# Patient Record
Sex: Female | Born: 1970 | Race: Black or African American | Hispanic: No | Marital: Married | Smoking: Former smoker
Health system: Southern US, Community
[De-identification: ages and names within clinical notes are randomized; demographics above are authoritative.]

## PROBLEM LIST (undated history)

## (undated) DIAGNOSIS — I10 Essential (primary) hypertension: Secondary | ICD-10-CM

---

## 1998-04-06 ENCOUNTER — Other Ambulatory Visit: Admission: RE | Admit: 1998-04-06 | Discharge: 1998-04-06 | Payer: Self-pay | Admitting: Obstetrics and Gynecology

## 1999-08-29 ENCOUNTER — Other Ambulatory Visit: Admission: RE | Admit: 1999-08-29 | Discharge: 1999-08-29 | Payer: Self-pay | Admitting: Gynecology

## 2000-03-26 ENCOUNTER — Other Ambulatory Visit: Admission: RE | Admit: 2000-03-26 | Discharge: 2000-03-26 | Payer: Self-pay | Admitting: Gynecology

## 2000-10-29 ENCOUNTER — Other Ambulatory Visit: Admission: RE | Admit: 2000-10-29 | Discharge: 2000-10-29 | Payer: Self-pay | Admitting: Gynecology

## 2001-12-15 ENCOUNTER — Other Ambulatory Visit: Admission: RE | Admit: 2001-12-15 | Discharge: 2001-12-15 | Payer: Self-pay | Admitting: Gynecology

## 2010-03-27 ENCOUNTER — Other Ambulatory Visit: Payer: Self-pay | Admitting: Obstetrics and Gynecology

## 2010-03-27 DIAGNOSIS — Z139 Encounter for screening, unspecified: Secondary | ICD-10-CM

## 2010-04-10 ENCOUNTER — Encounter (HOSPITAL_COMMUNITY)
Admission: RE | Admit: 2010-04-10 | Discharge: 2010-04-10 | Disposition: A | Discharge status: Home / Self Care | Payer: BC Managed Care – PPO | Source: Ambulatory Visit | Attending: Obstetrics and Gynecology | Admitting: Obstetrics and Gynecology

## 2010-04-10 LAB — CBC
MCH: 18.5 pg — ABNORMAL LOW (ref 26.0–34.0)
MCHC: 28.8 g/dL — ABNORMAL LOW (ref 30.0–36.0)
MCV: 64.1 fL — ABNORMAL LOW (ref 78.0–100.0)
Platelets: 562 10*3/uL — ABNORMAL HIGH (ref 150–400)
RDW: 17.4 % — ABNORMAL HIGH (ref 11.5–15.5)

## 2010-04-10 LAB — BASIC METABOLIC PANEL
BUN: 12 mg/dL (ref 6–23)
Calcium: 9.6 mg/dL (ref 8.4–10.5)
Creatinine, Ser: 0.81 mg/dL (ref 0.4–1.2)
GFR calc non Af Amer: >60 mL/min (ref >60)

## 2010-04-13 ENCOUNTER — Other Ambulatory Visit: Payer: Self-pay | Admitting: Obstetrics and Gynecology

## 2010-04-13 ENCOUNTER — Ambulatory Visit (HOSPITAL_COMMUNITY)
Admission: RE | Admit: 2010-04-13 | Discharge: 2010-04-13 | Disposition: A | Discharge status: Home / Self Care | Payer: BC Managed Care – PPO | Source: Ambulatory Visit | Attending: Obstetrics and Gynecology | Admitting: Obstetrics and Gynecology

## 2010-04-13 DIAGNOSIS — N840 Polyp of corpus uteri: Secondary | ICD-10-CM | POA: Insufficient documentation

## 2010-04-13 DIAGNOSIS — Z01812 Encounter for preprocedural laboratory examination: Secondary | ICD-10-CM | POA: Insufficient documentation

## 2010-04-13 DIAGNOSIS — D649 Anemia, unspecified: Secondary | ICD-10-CM | POA: Insufficient documentation

## 2010-04-13 DIAGNOSIS — Z01818 Encounter for other preprocedural examination: Secondary | ICD-10-CM | POA: Insufficient documentation

## 2010-04-13 DIAGNOSIS — N920 Excessive and frequent menstruation with regular cycle: Secondary | ICD-10-CM | POA: Insufficient documentation

## 2010-04-13 LAB — TYPE AND SCREEN: ABO/RH(D): O POS

## 2010-04-13 LAB — ABO/RH: ABO/RH(D): O POS

## 2010-04-20 ENCOUNTER — Ambulatory Visit (HOSPITAL_BASED_OUTPATIENT_CLINIC_OR_DEPARTMENT_OTHER): Payer: Self-pay

## 2010-04-30 NOTE — Op Note (Signed)
NAME:  Kaitlyn Scott, Kaitlyn Scott          ACCOUNT NO.:  [id number]  MEDICAL RECORD NO.:  [id number]           PATIENT TYPE:  O  LOCATION:  [facility]                          FAC[facility]:  [facility]  PHYSICIAN:  Pieter Partridge, MD   DATE OF BIRTH:  [birth year]  DATE OF PROCEDURE:  04/13/2010 DATE OF DISCHARGE:                              OPERATIVE REPORT   PREOPERATIVE DIAGNOSES:  Menorrhagia and endometrial mass.  POSTOPERATIVE DIAGNOSES:  Menorrhagia and endometrial mass.  PROCEDURE:  Hysteroscopy, dilation and curettage.  SURGEON:  Pieter Partridge, MD  ASSISTANT:  Technician.  ANESTHESIA:  General.  FINDINGS:  Normal intrauterine cavity, no obvious discrete endometrial mass, posteriorly there was some prominent rugae that were biopsied.  SPECIMENS:  Posterior endometrial tissue, endometrial curettings, and endocervical cord curettings.  DISPOSITION:  Specimens to Pathology.  ESTIMATED BLOOD LOSS:  Minimal.  IV FLUIDS:  1200.  URINE OUTPUT:  25 mL prior to procedure.  COMPLICATIONS:  None.  DISPOSITION:  To PACU in stable condition.  INDICATION:  A 40 year old gravida 2, para 1, A 1 with a history of increasing menorrhagia worsened in the last 3 months.  On ultrasound, there was a 1-cm fundal posterior endometrial mass that was hyperechoic and did not appear to be a polyp on ultrasound.  The patient was also severely anemic.  Her hemoglobin at the time of diagnosis was 7.4.  She has been on iron to increase it up to 8.7.  PROCEDURE IN DETAIL:  Ms. Spainhower was identified in the holding area. She was then taken to the operating room with IV running.  She was placed in the dorsal supine position and underwent general endotracheal anesthesia without complication.  She was then prepped and draped in a normal sterile fashion.  A Graves speculum was then inserted into the vagina.  After a bimanual exam confirmed an anteverted uterus, speculum was placed in the vagina, anterior  lip of the cervix was grasped with a single-tooth tenaculum, and the cervix was dilated up to a 15.  The hysteroscope was then advanced to reveal the above findings.  Two biopsies of the posterior wall were done.  The instruments were then removed.  Curettage of all 4 quadrants of the uterus was performed with minimal tissue noted.  I was actually surprised at how little endometrium she had.  Prior to the endocervix were curettings, I performed curettage of the endocervix and both specimens were sent to Path.  After the curettings were obtained, the cervical os was observed for bleeding.  Everything looked hemostatic.  Single-tooth tenaculum was taken out.  It was also hemostatic, and the vagina was cleared of all clots and debris and the Graves speculum was then removed.  The patient tolerated the procedure well and was awakened and taken to the recovery room in stable condition.  Instrument and sponge counts were correct x3.  The patient received Ancef 2 grams IV prior to procedure.  She had SCDs on during the procedure.   Pieter Partridge, MD     EBV/MEDQ  D:  04/13/2010  T:  04/13/2010  Job:  [id number]  Electronically Signed by Geryl Rankins MD on  04/30/2010 09:12:28 AM

## 2010-05-01 ENCOUNTER — Inpatient Hospital Stay (HOSPITAL_BASED_OUTPATIENT_CLINIC_OR_DEPARTMENT_OTHER): Admission: RE | Admit: 2010-05-01 | Payer: Self-pay | Source: Ambulatory Visit

## 2012-06-18 ENCOUNTER — Emergency Department (HOSPITAL_BASED_OUTPATIENT_CLINIC_OR_DEPARTMENT_OTHER)
Admission: EM | Admit: 2012-06-18 | Discharge: 2012-06-19 | Disposition: A | Discharge status: Home / Self Care | Payer: BC Managed Care – PPO | Attending: Emergency Medicine | Admitting: Emergency Medicine

## 2012-06-18 ENCOUNTER — Encounter (HOSPITAL_BASED_OUTPATIENT_CLINIC_OR_DEPARTMENT_OTHER): Payer: Self-pay | Admitting: Emergency Medicine

## 2012-06-18 ENCOUNTER — Emergency Department (HOSPITAL_BASED_OUTPATIENT_CLINIC_OR_DEPARTMENT_OTHER): Payer: BC Managed Care – PPO

## 2012-06-18 DIAGNOSIS — R05 Cough: Secondary | ICD-10-CM | POA: Insufficient documentation

## 2012-06-18 DIAGNOSIS — Z87891 Personal history of nicotine dependence: Secondary | ICD-10-CM | POA: Insufficient documentation

## 2012-06-18 DIAGNOSIS — J3489 Other specified disorders of nose and nasal sinuses: Secondary | ICD-10-CM | POA: Insufficient documentation

## 2012-06-18 DIAGNOSIS — I10 Essential (primary) hypertension: Secondary | ICD-10-CM | POA: Insufficient documentation

## 2012-06-18 DIAGNOSIS — R059 Cough, unspecified: Secondary | ICD-10-CM | POA: Insufficient documentation

## 2012-06-18 DIAGNOSIS — R0982 Postnasal drip: Secondary | ICD-10-CM | POA: Insufficient documentation

## 2012-06-18 HISTORY — DX: Essential (primary) hypertension: I10

## 2012-06-18 MED ORDER — FLUTICASONE PROPIONATE 50 MCG/ACT NA SUSP: 2.0000 | Freq: Every day | NASAL | Status: AC

## 2012-06-18 MED ORDER — BENZONATATE 100 MG PO CAPS: 100.0000 mg | ORAL_CAPSULE | Freq: Three times a day (TID) | ORAL | Status: AC

## 2012-06-18 MED ORDER — LORATADINE 10 MG PO TABS: 10.0000 mg | ORAL_TABLET | Freq: Every day | ORAL | Status: AC

## 2012-06-18 NOTE — ED Notes (Signed)
Productive Cough x3 days.

## 2012-06-18 NOTE — ED Provider Notes (Signed)
History     CSN: [id number]  Arrival date & time 06/18/12  2302   First MD Initiated Contact with Patient 06/18/12 2335      Chief Complaint  Patient presents with  . Cough    (Consider location/radiation/quality/duration/timing/severity/associated sxs/prior treatment) Patient is a 42 y.o. female presenting with cough. The history is provided by the patient.  Cough Cough characteristics:  Productive Sputum characteristics:  White Severity:  Moderate Onset quality:  Sudden Duration:  3 days Timing:  Intermittent Progression:  Unchanged Chronicity:  New Smoker: no   Context: not smoke exposure   Relieved by:  Nothing Worsened by:  Nothing tried Ineffective treatments:  None tried Associated symptoms: no chest pain, no fever and no wheezing     Past Medical History  Diagnosis Date  . Hypertension     History reviewed. No pertinent past surgical history.  No family history on file.  History  Substance Use Topics  . Smoking status: Former Games developer  . Smokeless tobacco: Not on file  . Alcohol Use: No    OB History   Grav Para Term Preterm Abortions TAB SAB Ect Mult Living                  Review of Systems  Constitutional: Negative for fever.  HENT: Positive for congestion. Negative for neck pain.   Respiratory: Positive for cough. Negative for wheezing.   Cardiovascular: Negative for chest pain.  All other systems reviewed and are negative.    Allergies  Review of patient's allergies indicates no known allergies.  Home Medications   Current Outpatient Rx  Name  Route  Sig  Dispense  Refill  . chlorothiazide (DIURIL) 250 MG tablet   Oral   Take 250 mg by mouth at bedtime.           BP 169/102  Pulse 80  Temp(Src) 99 F (37.2 C) (Oral)  Resp 16  Ht 5\' 6"  (1.676 m)  Wt 255 lb (115.667 kg)  BMI 41.18 kg/m2  SpO2 96%  LMP 06/11/2012  Physical Exam  Constitutional: She is oriented to person, place, and time. She appears well-developed. No  distress.  HENT:  Head: Normocephalic and atraumatic.  Cobblestoning and drainage cw PND  Eyes: Pupils are equal, round, and reactive to light.  Neck: Normal range of motion. Neck supple.  Cardiovascular: Normal rate.   Pulmonary/Chest: Effort normal and breath sounds normal. She has no wheezes. She has no rales.  Abdominal: Soft. Bowel sounds are normal. There is no tenderness. There is no rebound and no guarding.  Musculoskeletal: Normal range of motion.  Neurological: She is alert and oriented to person, place, and time.  Skin: Skin is warm and dry.  Psychiatric: She has a normal mood and affect.    ED Course  Procedures (including critical care time)  Labs Reviewed - No data to display Dg Chest 2 View  06/18/2012   *RADIOLOGY REPORT*  Clinical Data: Productive cough, hypertension.  CHEST - 2 VIEW  Comparison: None  Findings: Heart and mediastinal contours are within normal limits. No focal opacities or effusions.  No acute bony abnormality.  IMPRESSION: No active cardiopulmonary disease.   Original Report Authenticated By: Charlett Nose, M.D.     No diagnosis found.    MDM  Will treat for post nasal drip        Karriem Muench K Relena Ivancic-Rasch, MD 06/18/12 2344

## 2012-06-18 NOTE — ED Notes (Signed)
MD at bedside. 

## 2013-07-19 ENCOUNTER — Emergency Department (HOSPITAL_BASED_OUTPATIENT_CLINIC_OR_DEPARTMENT_OTHER)
Admission: EM | Admit: 2013-07-19 | Discharge: 2013-07-19 | Disposition: A | Discharge status: Home / Self Care | Payer: BC Managed Care – PPO | Attending: Emergency Medicine | Admitting: Emergency Medicine

## 2013-07-19 ENCOUNTER — Encounter (HOSPITAL_BASED_OUTPATIENT_CLINIC_OR_DEPARTMENT_OTHER): Payer: Self-pay | Admitting: Emergency Medicine

## 2013-07-19 DIAGNOSIS — IMO0002 Reserved for concepts with insufficient information to code with codable children: Secondary | ICD-10-CM | POA: Insufficient documentation

## 2013-07-19 DIAGNOSIS — R059 Cough, unspecified: Secondary | ICD-10-CM | POA: Insufficient documentation

## 2013-07-19 DIAGNOSIS — I10 Essential (primary) hypertension: Secondary | ICD-10-CM | POA: Insufficient documentation

## 2013-07-19 DIAGNOSIS — Z87891 Personal history of nicotine dependence: Secondary | ICD-10-CM | POA: Insufficient documentation

## 2013-07-19 DIAGNOSIS — J029 Acute pharyngitis, unspecified: Secondary | ICD-10-CM | POA: Insufficient documentation

## 2013-07-19 DIAGNOSIS — J309 Allergic rhinitis, unspecified: Secondary | ICD-10-CM | POA: Insufficient documentation

## 2013-07-19 DIAGNOSIS — R05 Cough: Secondary | ICD-10-CM | POA: Insufficient documentation

## 2013-07-19 DIAGNOSIS — Z79899 Other long term (current) drug therapy: Secondary | ICD-10-CM | POA: Insufficient documentation

## 2013-07-19 DIAGNOSIS — J302 Other seasonal allergic rhinitis: Secondary | ICD-10-CM

## 2013-07-19 MED ORDER — SALINE SPRAY 0.65 % NA SOLN: 1.0000 | NASAL | Status: AC | PRN

## 2013-07-19 MED ORDER — LORATADINE 10 MG PO TABS: 10.0000 mg | ORAL_TABLET | Freq: Every day | ORAL | Status: AC

## 2013-07-19 NOTE — ED Notes (Signed)
Pt c/o URI symptoms x 1 weeks

## 2013-07-19 NOTE — ED Notes (Signed)
MD at bedside. 

## 2013-07-19 NOTE — ED Provider Notes (Signed)
CSN: [id number]     Arrival date & time 07/19/13  1735 History  This chart was scribed for Shon Batonourtney F Horton, MD by Charline BillsEssence Howell, ED Scribe. The patient was seen in room MH06/MH06. Patient's care was started at 5:57 PM.   Chief Complaint  Patient presents with  . URI   The history is provided by the patient. No language interpreter was used.   HPI Comments: Kaitlyn Scott is a 43 y.o. female, with a h/o HTN, who presents to the Emergency Department complaining of sore throat and cough onset 11 days ago. Pt reports associated nasal congestion, watery eyes, sore cervical lymph nodes, pain with swallowing. Pt describes the feeling in her throat as a globus sensation. She denies rhinorrhea, fever, chest pain, SOB, nausea, vomiting. 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 Pt has tried Benadryl, nasal spray, Alka-Seltzer and NyQuil for symptoms.   Past Medical History  Diagnosis Date  . Hypertension    History reviewed. No pertinent past surgical history. History reviewed. No pertinent family history. History  Substance Use Topics  . Smoking status: Former Games developermoker  . Smokeless tobacco: Not on file  . Alcohol Use: No   OB History   Grav Para Term Preterm Abortions TAB SAB Ect Mult Living                 Review of Systems  Constitutional: Negative for fever.  HENT: Positive for congestion, postnasal drip and sore throat. Negative for ear pain, trouble swallowing and voice change.   Respiratory: Positive for cough. Negative for chest tightness and shortness of breath.   Cardiovascular: Negative for chest pain.  Gastrointestinal: Negative for nausea, vomiting, abdominal pain and diarrhea.  Genitourinary: Negative for dysuria.  Skin: Negative for rash.  Neurological: Negative for headaches.  All other systems reviewed and are negative.   Allergies  Review of patient's allergies indicates no known allergies.  Home Medications    Prior to Admission medications   Medication Sig Start Date End Date Taking? Authorizing Provider  benzonatate (TESSALON) 100 MG capsule Take 1 capsule (100 mg total) by mouth every 8 (eight) hours. 06/18/12   April K Palumbo-Rasch, MD  chlorothiazide (DIURIL) 250 MG tablet Take 250 mg by mouth at bedtime.    Historical Provider, MD  fluticasone (FLONASE) 50 MCG/ACT nasal spray Place 2 sprays into the nose daily. 06/18/12   April K Palumbo-Rasch, MD  loratadine (CLARITIN) 10 MG tablet Take 1 tablet (10 mg total) by mouth daily. 06/18/12   April K Palumbo-Rasch, MD  loratadine (CLARITIN) 10 MG tablet Take 1 tablet (10 mg total) by mouth daily. 07/19/13   Shon Batonourtney F Horton, MD  sodium chloride (OCEAN) 0.65 % SOLN nasal spray Place 1 spray into both nostrils as needed for congestion. 07/19/13   Shon Batonourtney F Horton, MD   Triage Vitals: BP 158/86  Pulse 88  Temp(Src) 98.2 F (36.8 C) (Oral)  Resp 16  Ht 5\' 6"  (1.676 m)  Wt 265 lb (120.203 kg)  BMI 42.79 kg/m2  SpO2 100%  LMP 06/22/2013 Physical Exam  Nursing note and vitals reviewed. Constitutional: She is oriented to person, place, and time. She appears well-developed and well-nourished. No distress.  HENT:  Head: Normocephalic and atraumatic.  Mouth/Throat: Oropharynx is clear and moist. No oropharyngeal exudate.  Fullness without effusion behind the bilateral ears, dull to light reflex, posterior oropharynx without significant erythema, postnasal drip noted, no exudate noted  Eyes: Pupils are equal, round, and  reactive to light.  Neck: Normal range of motion. Neck supple.  Cardiovascular: Normal rate, regular rhythm and normal heart sounds.   Pulmonary/Chest: Effort normal and breath sounds normal. No respiratory distress. She has no wheezes.  Abdominal: Soft. There is no tenderness.  Lymphadenopathy:    She has no cervical adenopathy.  Neurological: She is alert and oriented to person, place, and time.  Skin: Skin is warm and dry. No  rash noted.  Psychiatric: She has a normal mood and affect.    ED Course  Procedures (including critical care time) DIAGNOSTIC STUDIES: Oxygen Saturation is 100% on RA, normal by my interpretation.    COORDINATION OF CARE: 6:06 PM-Discussed treatment plan with pt at bedside and pt agreed to plan.   Labs Review Labs Reviewed - No data to display  Imaging Review No results found.   EKG Interpretation None      MDM   Final diagnoses:  Sore throat  Seasonal allergies    Patient presents with approximately 10 days of congestion, sore throat, dry cough. She denies any fevers. She is nontoxic on exam and vital signs are reassuring. Patient has fullness behind her bilateral TMs. Evidence of postnasal drip. History seasonal allergies. Suspect patient's current symptoms may be related to allergies versus virus given duration of symptoms. Low suspicion at this time for strep throat given exam and lack of fever. Will initiate patient on Claritin and have encouraged patient to use nasal saline for congestion. Patient stated understanding. Patient's pulmonary exam is benign and she's been without fever, low suspicion of pneumonia as a source of cough.  After history, exam, and medical workup I feel the patient has been appropriately medically screened and is safe for discharge home. Pertinent diagnoses were discussed with the patient. Patient was given return precautions.   I personally performed the services described in this documentation, which was scribed in my presence. The recorded information has been reviewed and is accurate.    Shon Batonourtney F Horton, MD 07/19/13 [phone]

## 2013-07-19 NOTE — Discharge Instructions (Signed)
Allergies °Allergies may happen from anything your body is sensitive to. This may be food, medicines, pollens, chemicals, and nearly anything around you in everyday life that produces allergens. An allergen is anything that causes an allergy producing substance. Heredity is often a factor in causing these problems. This means you may have some of the same allergies as your parents. °Food allergies happen in all age groups. Food allergies are some of the most severe and life threatening. Some common food allergies are cow's milk, seafood, eggs, nuts, wheat, and soybeans. °SYMPTOMS  °· Swelling around the mouth. °· An itchy red rash or hives. °· Vomiting or diarrhea. °· Difficulty breathing. °SEVERE ALLERGIC REACTIONS ARE LIFE-THREATENING. °This reaction is called anaphylaxis. It can cause the mouth and throat to swell and cause difficulty with breathing and swallowing. In severe reactions only a trace amount of food (for example, peanut oil in a salad) may cause death within seconds. °Seasonal allergies occur in all age groups. These are seasonal because they usually occur during the same season every year. They may be a reaction to molds, grass pollens, or tree pollens. Other causes of problems are house dust mite allergens, pet dander, and mold spores. The symptoms often consist of nasal congestion, a runny itchy nose associated with sneezing, and tearing itchy eyes. There is often an associated itching of the mouth and ears. The problems happen when you come in contact with pollens and other allergens. Allergens are the particles in the air that the body reacts to with an allergic reaction. This causes you to release allergic antibodies. Through a chain of events, these eventually cause you to release histamine into the blood stream. Although it is meant to be protective to the body, it is this release that causes your discomfort. This is why you were given anti-histamines to feel better.  If you are unable to  pinpoint the offending allergen, it may be determined by skin or blood testing. Allergies cannot be cured but can be controlled with medicine. °Hay fever is a collection of all or some of the seasonal allergy problems. It may often be treated with simple over-the-counter medicine such as diphenhydramine. Take medicine as directed. Do not drink alcohol or drive while taking this medicine. Check with your caregiver or package insert for child dosages. °If these medicines are not effective, there are many new medicines your caregiver can prescribe. Stronger medicine such as nasal spray, eye drops, and corticosteroids may be used if the first things you try do not work well. Other treatments such as immunotherapy or desensitizing injections can be used if all else fails. Follow up with your caregiver if problems continue. These seasonal allergies are usually not life threatening. They are generally more of a nuisance that can often be handled using medicine. °HOME CARE INSTRUCTIONS  °· If unsure what causes a reaction, keep a diary of foods eaten and symptoms that follow. Avoid foods that cause reactions. °· If hives or rash are present: °· Take medicine as directed. °· You may use an over-the-counter antihistamine (diphenhydramine) for hives and itching as needed. °· Apply cold compresses (cloths) to the skin or take baths in cool water. Avoid hot baths or showers. Heat will make a rash and itching worse. °· If you are severely allergic: °· Following a treatment for a severe reaction, hospitalization is often required for closer follow-up. °· Wear a medic-alert bracelet or necklace stating the allergy. °· You and your family must learn how to give adrenaline or use   an anaphylaxis kit. °· If you have had a severe reaction, always carry your anaphylaxis kit or EpiPen® with you. Use this medicine as directed by your caregiver if a severe reaction is occurring. Failure to do so could have a fatal outcome. °SEEK MEDICAL  CARE IF: °· You suspect a food allergy. Symptoms generally happen within 30 minutes of eating a food. °· Your symptoms have not gone away within 2 days or are getting worse. °· You develop new symptoms. °· You want to retest yourself or your child with a food or drink you think causes an allergic reaction. Never do this if an anaphylactic reaction to that food or drink has happened before. Only do this under the care of a caregiver. °SEEK IMMEDIATE MEDICAL CARE IF:  °· You have difficulty breathing, are wheezing, or have a tight feeling in your chest or throat. °· You have a swollen mouth, or you have hives, swelling, or itching all over your body. °· You have had a severe reaction that has responded to your anaphylaxis kit or an EpiPen®. These reactions may return when the medicine has worn off. These reactions should be considered life threatening. °MAKE SURE YOU:  °· Understand these instructions. °· Will watch your condition. °· Will get help right away if you are not doing well or get worse. °Document Released: 04/16/2002 Document Revised: 05/18/2012 Document Reviewed: 09/21/2007 °[employer]® Patient Information ©2014 [employer]. ° °

## 2014-12-24 IMAGING — CR DG CHEST 2V
2 series · 2 of 2 positions shown · non-contrast
Comparison: None

CLINICAL DATA: Productive cough, hypertension.

CHEST - 2 VIEW

[w chest pa]
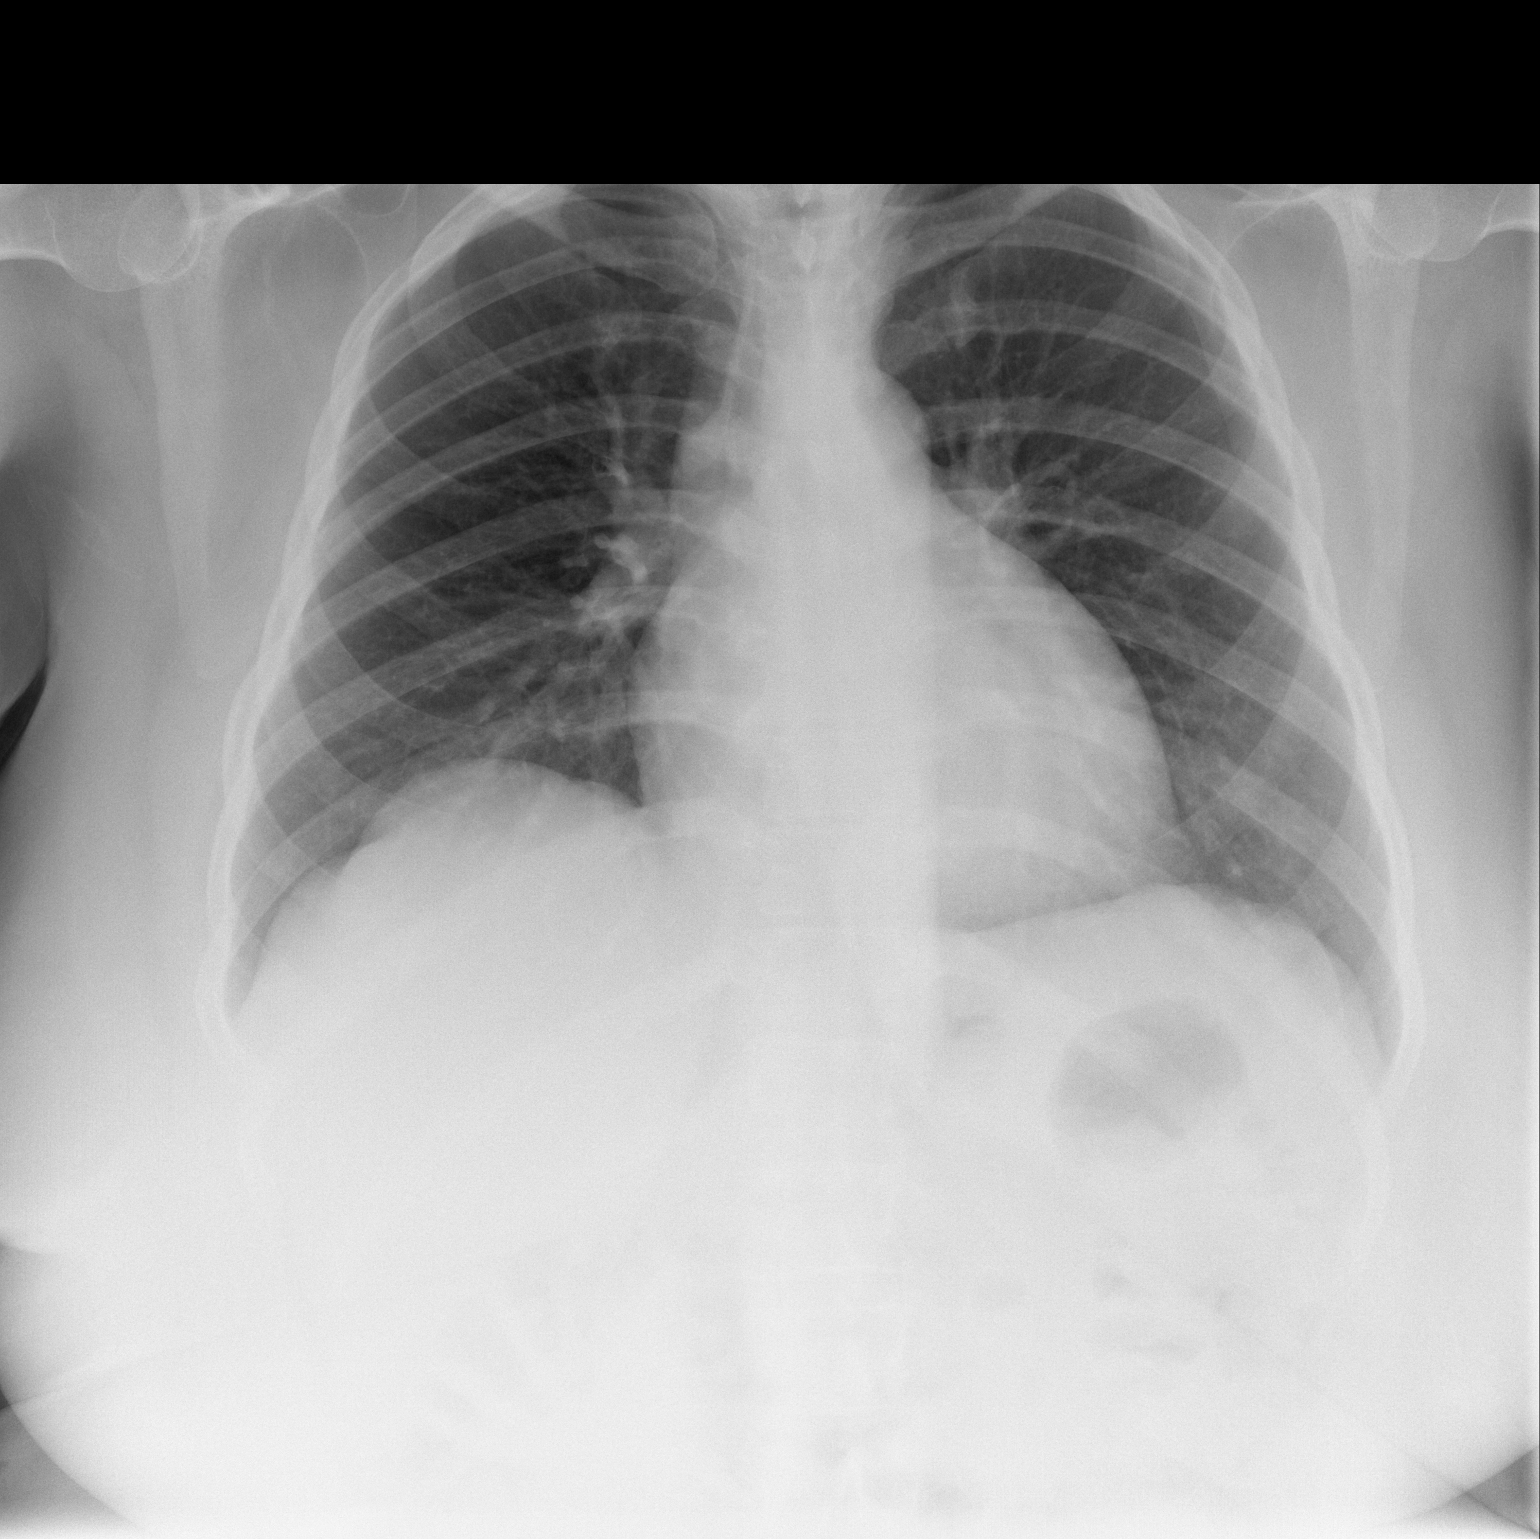

[w chest lat]
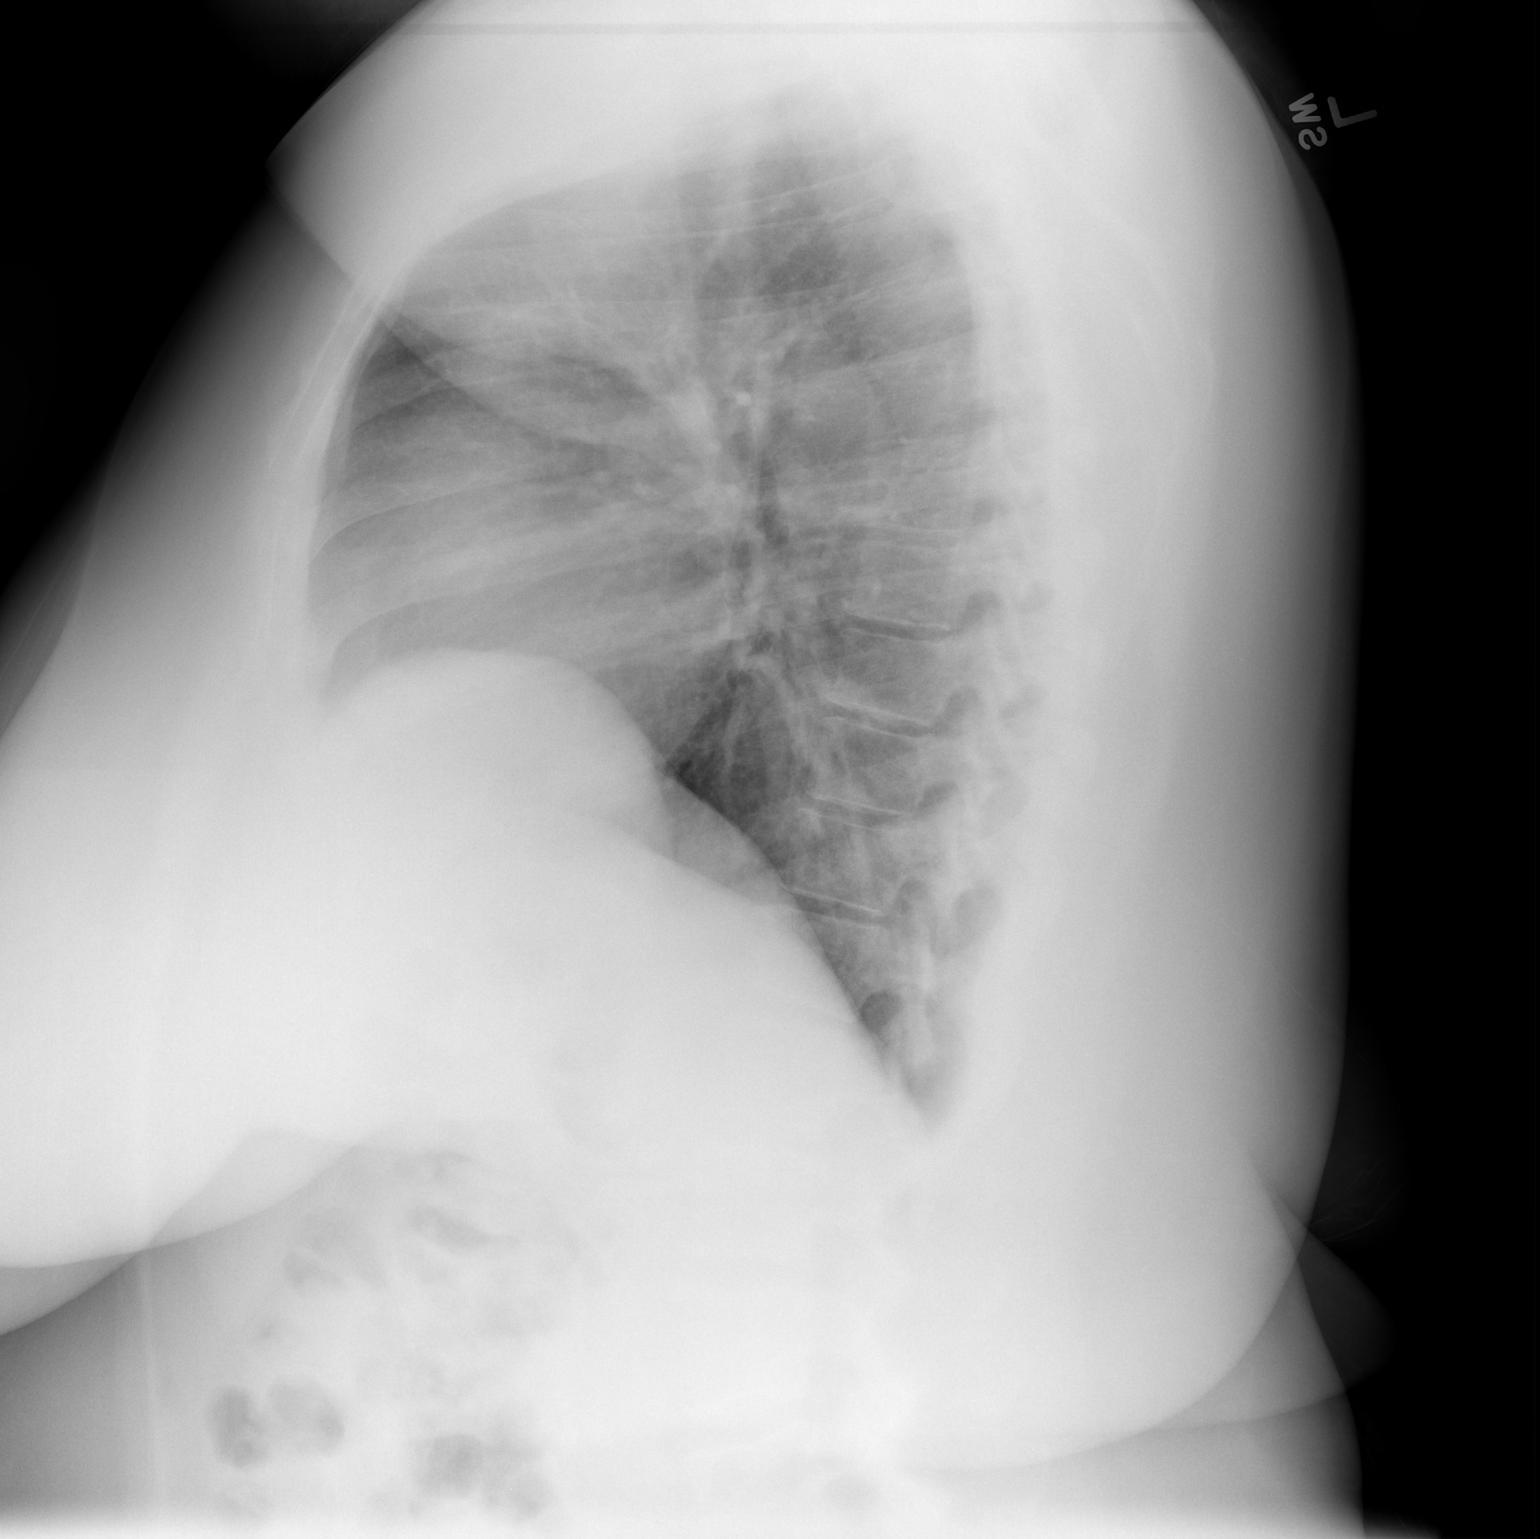

[2 of 2 positions shown; findings below may reference images not displayed]

FINDINGS: Heart and mediastinal contours are within normal limits.
No focal opacities or effusions.  No acute bony abnormality.
IMPRESSION: No active cardiopulmonary disease.

## 2016-01-11 ENCOUNTER — Other Ambulatory Visit (HOSPITAL_COMMUNITY)
Admission: RE | Admit: 2016-01-11 | Discharge: 2016-01-11 | Disposition: A | Discharge status: Home / Self Care | Payer: BC Managed Care – PPO | Source: Ambulatory Visit | Attending: Obstetrics and Gynecology | Admitting: Obstetrics and Gynecology

## 2016-01-11 ENCOUNTER — Other Ambulatory Visit: Payer: Self-pay | Admitting: Obstetrics and Gynecology

## 2016-01-11 DIAGNOSIS — Z1151 Encounter for screening for human papillomavirus (HPV): Secondary | ICD-10-CM | POA: Insufficient documentation

## 2016-01-11 DIAGNOSIS — Z01419 Encounter for gynecological examination (general) (routine) without abnormal findings: Secondary | ICD-10-CM | POA: Diagnosis present

## 2016-01-15 LAB — CYTOLOGY - PAP
Diagnosis: NEGATIVE
HPV (WINDOPATH): NOT DETECTED

## 2016-03-07 ENCOUNTER — Other Ambulatory Visit: Payer: Self-pay | Admitting: Obstetrics and Gynecology
